# Patient Record
Sex: Male | Born: 1955 | Race: Black or African American | Hispanic: No | Marital: Married | Smoking: Never smoker
Health system: Southern US, Community
[De-identification: ages and names within clinical notes are randomized; demographics above are authoritative.]

## PROBLEM LIST (undated history)

## (undated) DIAGNOSIS — Z6841 Body Mass Index (BMI) 40.0 and over, adult: Secondary | ICD-10-CM

## (undated) DIAGNOSIS — E119 Type 2 diabetes mellitus without complications: Secondary | ICD-10-CM

## (undated) DIAGNOSIS — Z923 Personal history of irradiation: Secondary | ICD-10-CM

## (undated) DIAGNOSIS — I10 Essential (primary) hypertension: Secondary | ICD-10-CM

## (undated) DIAGNOSIS — C61 Malignant neoplasm of prostate: Secondary | ICD-10-CM

## (undated) HISTORY — DX: Body Mass Index (BMI) 40.0 and over, adult: Z684

## (undated) HISTORY — PX: FOOT SURGERY: SHX648

## (undated) HISTORY — DX: Personal history of irradiation: Z92.3

## (undated) HISTORY — PX: PROSTATE BIOPSY: SHX241

## (undated) HISTORY — DX: Morbid (severe) obesity due to excess calories: E66.01

## (undated) HISTORY — DX: Type 2 diabetes mellitus without complications: E11.9

---

## 2010-06-15 HISTORY — PX: KNEE SURGERY: SHX244

## 2010-10-08 ENCOUNTER — Emergency Department (HOSPITAL_COMMUNITY): Payer: Worker's Compensation

## 2010-10-08 ENCOUNTER — Observation Stay (HOSPITAL_COMMUNITY)
Admission: EM | Admit: 2010-10-08 | Discharge: 2010-10-11 | Disposition: A | Discharge status: Home / Self Care | Payer: Worker's Compensation | Attending: Orthopedic Surgery | Admitting: Orthopedic Surgery

## 2010-10-08 DIAGNOSIS — Y9269 Other specified industrial and construction area as the place of occurrence of the external cause: Secondary | ICD-10-CM | POA: Insufficient documentation

## 2010-10-08 DIAGNOSIS — W108XXA Fall (on) (from) other stairs and steps, initial encounter: Secondary | ICD-10-CM | POA: Insufficient documentation

## 2010-10-08 DIAGNOSIS — Y990 Civilian activity done for income or pay: Secondary | ICD-10-CM | POA: Insufficient documentation

## 2010-10-08 DIAGNOSIS — I10 Essential (primary) hypertension: Secondary | ICD-10-CM | POA: Insufficient documentation

## 2010-10-08 DIAGNOSIS — S838X9A Sprain of other specified parts of unspecified knee, initial encounter: Principal | ICD-10-CM | POA: Insufficient documentation

## 2010-10-08 LAB — COMPREHENSIVE METABOLIC PANEL
ALT: 23 U/L (ref 0–53)
Alkaline Phosphatase: 75 U/L (ref 39–117)
CO2: 28 mEq/L (ref 19–32)
Calcium: 9 mg/dL (ref 8.4–10.5)
Chloride: 105 mEq/L (ref 96–112)
GFR calc non Af Amer: >60 mL/min (ref >60)
Glucose, Bld: 113 mg/dL — ABNORMAL HIGH (ref 70–99)
Potassium: 4.1 mEq/L (ref 3.5–5.1)
Sodium: 138 mEq/L (ref 135–145)
Total Bilirubin: 0.4 mg/dL (ref 0.3–1.2)

## 2010-10-08 LAB — CBC
Hemoglobin: 13 g/dL (ref 13.0–17.0)
MCH: 27.2 pg (ref 26.0–34.0)
MCHC: 32.7 g/dL (ref 30.0–36.0)

## 2010-10-08 LAB — DIFFERENTIAL
Basophils Relative: 0 % (ref 0–1)
Eosinophils Absolute: 0 10*3/uL (ref 0.0–0.7)
Monocytes Absolute: 0.5 10*3/uL (ref 0.1–1.0)
Monocytes Relative: 5 % (ref 3–12)
Neutro Abs: 8.4 10*3/uL — ABNORMAL HIGH (ref 1.7–7.7)

## 2010-10-08 LAB — PROTIME-INR: Prothrombin Time: 13.9 seconds (ref 11.6–15.2)

## 2010-10-09 LAB — SURGICAL PCR SCREEN: Staphylococcus aureus: NEGATIVE

## 2010-10-10 LAB — BASIC METABOLIC PANEL
BUN: 15 mg/dL (ref 6–23)
Calcium: 8.4 mg/dL (ref 8.4–10.5)
Creatinine, Ser: 1 mg/dL (ref 0.4–1.5)
GFR calc Af Amer: >60 mL/min (ref >60)

## 2010-10-10 LAB — CBC
MCH: 27.1 pg (ref 26.0–34.0)
MCHC: 32.8 g/dL (ref 30.0–36.0)
Platelets: 307 10*3/uL (ref 150–400)
RDW: 14.7 % (ref 11.5–15.5)

## 2010-10-17 NOTE — Op Note (Signed)
NAME:  Bryan Newman, Bryan Newman             ACCOUNT NO.:  [id number]  MEDICAL RECORD NO.:  [id number]          PATIENT TYPE:  LOCATION:                                 FACILITY:  PHYSICIAN:  Jones Broom, MD    DATE OF BIRTH:  06/24/1955  DATE OF PROCEDURE:  10/09/2010 DATE OF DISCHARGE:                              OPERATIVE REPORT   PREOPERATIVE DIAGNOSES: 1. Right quadriceps tendon rupture. 2. Left quadriceps tendon rupture.  POSTOPERATIVE DIAGNOSES: 1. Right quadriceps tendon rupture. 2. Left quadriceps tendon rupture.  PROCEDURE PERFORMED: 1. Open repair right quadriceps tendon. 2. Open repair left quadriceps tendon.  ATTENDING SURGEON:  Jones Broom, MD  ASSISTANT:  None.  ANESTHESIA:  GETA.  COMPLICATIONS:  None.  DRAINS:  None.  SPECIMENS:  None.  ESTIMATED BLOOD LOSS:  100 mL.  TOURNIQUET TIME:  None.  INDICATIONS FOR SURGERY:  The patient is a 55 year old gentleman who had a fall yesterday at work and stumbled down a couple of steps suffering a hyperflexion injury to both knees.  He felt a pop on both sides and was unable to ambulate after that time.  His exam was consistent with bilateral quadriceps tendon ruptures.  He was indicated for surgery to promote ambulation and restore the extensor mechanism.  He understood risks, benefits, and alternatives of the procedure including but not limited to risk of bleeding, infection, damage to neurovascular structures, risk of incomplete healing, and potential need for future surgery.  He elected to go forward with surgery.  FINDINGS:  Both the quadriceps tendons were repaired using one #5 FiberWire and two #2 FiberWire and each in a running locking Krackow suture through four bone tunnels in each patella.  The retinaculum was repaired.  The repair was felt to be excellent on both sides.  PROCEDURE:  The patient was identified in the preoperative holding area where I personally marked the operative sites  after verifying the site, side, and procedure with the patient.  He was then taken back to the operating room where general anesthesia was induced without complication.  He did receive 2 g IV Ancef within 30 minutes of the incision.  The bilateral lower extremities were prepped and draped in a standard sterile fashion.  The appropriate time-out procedure was carried out.  An approximately 8-cm incision was then made over the left patella.  Dissection was carried down to the level of the rupture. Medial and lateral skin flaps were made.  The rupture was identified and found to be complete.  There was extensive tearing of the medial and lateral retinaculum as well.  Copious irrigation was used.  No fracture was noted.  The superior portion of the patella was debrided using a rongeur.  Anterior soft tissues were left intact.  The tendon did rupture directly off its bony insertion.  The bony insertion was rongeured down to bleeding trough to promote healing.  Attention was then turned to the distal quadriceps tendon.  The tendon edge was ragged and was debrided slightly to trying and get back to more healthy- appearing tissue while still retaining length.  Three running locking Krackow sutures were then placed with  one central, one medial, and one lateral.  The central was a #5 FiberWire and the medial and lateral sutures were #2 FiberWire.  Tendon quality for the repair was noted to be very good.  Four drill holes were then made sequentially with an approximately 1.5-mm drill bit and each suture was then passed with two sutures through the middle two holes to allow tunneling over bone bridge.  The tendon was reduced and each suture was then sequentially tied bringing the tendon nicely down to the prepared superior pole of the patella.  After the repair, the medial and lateral retinacular rents were repaired using a 0 Vicryl suture and the anterior soft tissues were oversewn over the repair  using a running locking 0 Vicryl suture. Again, the wound was copiously irrigated and subsequently closed in layers using 2-0 Vicryl for the deep dermal layer and staples for skin closure.  The right wound was then covered and attention was then turned to the left where the exact same procedure as described above the right was performed.  There was absolutely no variation in the procedure as described above.  The tendon quality was also noted to be good except for the distal edge which was debrided.  Medial and lateral rents in the retinaculum were also repaired.  The repair quality was felt to be excellent.  After skin closure, both sides were then sterilely dressed using Adaptic, 4 x 4's, ABDs, Kerlix, and Ace bandage wrapped loosely. The patient was placed in bilateral knee immobilizers and allowed to awaken from general anesthesia.  He was transferred to the stretcher, taken to the recovery room in stable condition.  POSTOPERATIVE PLAN:  He will work with physical therapy with weightbearing as tolerated in his knee immobilizers with a walker.  He will remain in full extension.  He will have aspirin and SCDs for DVT prophylaxis.  He will be discharged when he clears physical therapy and his pain is well controlled.     Jones Broom, MD     JC/MEDQ  D:  10/09/2010  T:  10/10/2010  Job:  [id number]  Electronically Signed by Jones Broom  on 10/17/2010 08:23:27 AM

## 2010-10-17 NOTE — H&P (Signed)
NAME:  Bryan Newman, Bryan Newman             ACCOUNT NO.:  [id number]  MEDICAL RECORD NO.:  [id number]           PATIENT TYPE:  O  LOCATION:  5004                         FACILITY:  [facility]  PHYSICIAN:  Jones Broom, MD    DATE OF BIRTH:  July 24, 1955  DATE OF ADMISSION:  10/08/2010 DATE OF DISCHARGE:                             HISTORY & PHYSICAL   CHIEF COMPLAINT:  Bilateral knee pain and inability to ambulate after a fall.  HISTORY OF PRESENT ILLNESS:  Bryan Newman is a very pleasant 55 year old gentleman with a history of hypertension who had a fall at work.  He stumbled, coming down some steps and had hyperflexion injuries to both knees.  He felt a pop in both legs.  He noted that he was unable to ambulate after that time and had significant pain over the anterior- superior knees.  Does not describe any previous pain in this region prior to this event.  No other tendon ruptures that he is aware of.  He has not been on prolonged steroid medications.  PAST MEDICAL HISTORY:  Hypertension.  No diabetes.  FAMILY HISTORY:  Noncontributory.  SOCIAL HISTORY:  Denies tobacco or alcohol use.  He works as a Naval architect.  DRUG ALLERGIES:  NKDA.  MEDICATIONS:  Benicar.  REVIEW OF SYSTEMS:  Denies any numbness, tingling or weakness distal to the knees.  Otherwise negative except as described above.  PHYSICAL EXAMINATION:  GENERAL/VITAL SIGNS:  Today, he is afebrile with stable vital signs.  He is awake, alert and oriented x3. HEENT:  Extraocular motions intact. CHEST:  No use of accessory respiratory muscle for breathing. SKIN:  No skin rash. EXTREMITIES:  Examination of bilateral lower extremities demonstrates mild skin swelling, suprapatellar area.  I am able to palpate the gap on both sides, which is more obvious on the left than the right.  He is unable to perform straight leg raise on either leg.  Unable to resist any knee flexion at all.  I can see the quadriceps contracting on  both sides.  Distally, he has normal sensation to light touch on the dorsal and plantar aspect of the feet, has good strength in ankle dorsiflexion and plantar flexion and EHL.  2+ pedal pulses.  DIAGNOSTIC STUDIES:  X-rays of bilateral knees demonstrates mild chronic bony changes in the superior pole of the patella left greater than right.  Soft tissue findings appear consistent with quad tendon rupture, no acute fractures noted.  LABORATORY STUDIES:  Hemoglobin is 13.  IMPRESSION:  A 55 year old with bilateral acute quadriceps tendon ruptures.  PLAN:  He will be admitted to my service and the plan will be for operative fixation of both quadriceps tendons tomorrow.  He will be n.p.o.  He understands the risks, benefits and alternatives to procedure including but not limited to risk of bleeding, infection, damage to neurovascular structures, risk of nonhealing and re-tear.  He understands this and we would like to go ahead with surgery.  He will be kept in the hospital postoperatively for physical therapy until he is able to say that he is safe to get home with his family.  Jones Broom, MD     JC/MEDQ  D:  10/09/2010  T:  10/09/2010  Job:  [id number]  Electronically Signed by Jones Broom  on 10/17/2010 08:23:24 AM

## 2013-02-07 ENCOUNTER — Emergency Department (HOSPITAL_BASED_OUTPATIENT_CLINIC_OR_DEPARTMENT_OTHER)
Admission: EM | Admit: 2013-02-07 | Discharge: 2013-02-07 | Disposition: A | Discharge status: Home / Self Care | Payer: Managed Care, Other (non HMO) | Attending: Emergency Medicine | Admitting: Emergency Medicine

## 2013-02-07 ENCOUNTER — Emergency Department (HOSPITAL_BASED_OUTPATIENT_CLINIC_OR_DEPARTMENT_OTHER): Payer: Managed Care, Other (non HMO)

## 2013-02-07 ENCOUNTER — Encounter (HOSPITAL_BASED_OUTPATIENT_CLINIC_OR_DEPARTMENT_OTHER): Payer: Self-pay | Admitting: *Deleted

## 2013-02-07 DIAGNOSIS — I10 Essential (primary) hypertension: Secondary | ICD-10-CM | POA: Insufficient documentation

## 2013-02-07 DIAGNOSIS — K047 Periapical abscess without sinus: Secondary | ICD-10-CM

## 2013-02-07 HISTORY — DX: Essential (primary) hypertension: I10

## 2013-02-07 LAB — CBC WITH DIFFERENTIAL/PLATELET
Basophils Absolute: 0 10*3/uL (ref 0.0–0.1)
Eosinophils Absolute: 0.1 10*3/uL (ref 0.0–0.7)
Lymphocytes Relative: 22 % (ref 12–46)
Lymphs Abs: 1.7 10*3/uL (ref 0.7–4.0)
Neutrophils Relative %: 64 % (ref 43–77)
Platelets: 349 10*3/uL (ref 150–400)
RBC: 4.66 MIL/uL (ref 4.22–5.81)
RDW: 15.8 % — ABNORMAL HIGH (ref 11.5–15.5)
WBC: 7.9 10*3/uL (ref 4.0–10.5)

## 2013-02-07 LAB — BASIC METABOLIC PANEL
CO2: 30 mEq/L (ref 19–32)
Calcium: 9.6 mg/dL (ref 8.4–10.5)
GFR calc non Af Amer: 83 mL/min — ABNORMAL LOW (ref >90)
Glucose, Bld: 130 mg/dL — ABNORMAL HIGH (ref 70–99)
Potassium: 3.7 mEq/L (ref 3.5–5.1)
Sodium: 138 mEq/L (ref 135–145)

## 2013-02-07 MED ORDER — SODIUM CHLORIDE 0.9 % IV BOLUS (SEPSIS)
1000.0000 mL | Freq: Once | INTRAVENOUS | Status: AC
  Administered 2013-02-07: 1000 mL via INTRAVENOUS

## 2013-02-07 MED ORDER — HYDROCODONE-ACETAMINOPHEN 5-325 MG PO TABS: 1.0000 | ORAL_TABLET | ORAL | Status: DC | PRN

## 2013-02-07 MED ORDER — CLINDAMYCIN HCL 300 MG PO CAPS: 300.0000 mg | ORAL_CAPSULE | Freq: Four times a day (QID) | ORAL | Status: DC

## 2013-02-07 MED ORDER — IOHEXOL 300 MG/ML  SOLN
75.0000 mL | Freq: Once | INTRAMUSCULAR | Status: AC | PRN
  Administered 2013-02-07: 75 mL via INTRAVENOUS

## 2013-02-07 MED ORDER — ACETAMINOPHEN 500 MG PO TABS
1000.0000 mg | ORAL_TABLET | Freq: Once | ORAL | Status: AC
  Administered 2013-02-07: 1000 mg via ORAL
  Filled 2013-02-07: qty 2

## 2013-02-07 NOTE — ED Provider Notes (Signed)
CSN: [id number]     Arrival date & time 02/07/13  1754 History   First MD Initiated Contact with Patient 02/07/13 1810     Chief Complaint  Patient presents with  . Facial Swelling   (Consider location/radiation/quality/duration/timing/severity/associated sxs/prior Treatment) HPI Comments: 57 year old male with 2 days of left sided facial pain. He feels that his face has been swollen, and is tender to touch. He feels like he is feeling pain deeper inside his face above his counts. He's had some dental pain and has set up an appointment with a dentist for next week. Denies any fevers or chills. Denies any shortness of breath or chest pain. No neck pain. Tried some ibuprofen with some relief.  The history is provided by the patient.    Past Medical History  Diagnosis Date  . Hypertension    Past Surgical History  Procedure Laterality Date  . Knee surgery     No family history on file. History  Substance Use Topics  . Smoking status: Never Smoker   . Smokeless tobacco: Not on file  . Alcohol Use: No    Review of Systems  Constitutional: Negative for fever and chills.  HENT: Positive for facial swelling. Negative for neck pain and neck stiffness.   Respiratory: Negative for cough, shortness of breath, wheezing and stridor.   Cardiovascular: Negative for chest pain.  Neurological: Negative for headaches.  All other systems reviewed and are negative.    Allergies  Review of patient's allergies indicates no known allergies.  Home Medications  No current outpatient prescriptions on file. BP 145/80  Pulse 79  Temp(Src) 98.3 F (36.8 C) (Oral)  Resp 18  Wt 345 lb (156.491 kg)  SpO2 99% Physical Exam  Nursing note and vitals reviewed. Constitutional: He is oriented to person, place, and time. He appears well-developed and well-nourished.  HENT:  Head: Normocephalic and atraumatic.  Right Ear: External ear normal.  Left Ear: External ear normal.  Nose: Nose normal.    Mouth/Throat: Abnormal dentition.    Tenderness over left maxilla. No gingival erythema or signs of apical abscess  Eyes: EOM are normal. Pupils are equal, round, and reactive to light. Right eye exhibits no discharge. Left eye exhibits no discharge.  Neck: Neck supple.  Cardiovascular: Normal rate, regular rhythm, normal heart sounds and intact distal pulses.   Pulmonary/Chest: Effort normal and breath sounds normal. He has no wheezes.  Abdominal: Soft. There is no tenderness.  Neurological: He is alert and oriented to person, place, and time. No cranial nerve deficit. GCS eye subscore is 4. GCS verbal subscore is 5. GCS motor subscore is 6.  Skin: Skin is warm and dry.    ED Course  Procedures (including critical care time) Labs Review Labs Reviewed  CBC WITH DIFFERENTIAL - Abnormal; Notable for the following:    Hemoglobin 12.7 (*)    RDW 15.8 (*)    All other components within normal limits  BASIC METABOLIC PANEL - Abnormal; Notable for the following:    Glucose, Bld 130 (*)    GFR calc non Af Amer 83 (*)    All other components within normal limits   Imaging Review Ct Maxillofacial W/cm  02/07/2013   *RADIOLOGY REPORT*  Clinical Data: Left-sided maxillary pain for 2 days.  Possible swelling.  CT MAXILLOFACIAL WITH CONTRAST  Technique:  Multidetector CT imaging of the maxillofacial structures was performed with intravenous contrast. Multiplanar CT image reconstructions were also generated.  Contrast: 75mL OMNIPAQUE IOHEXOL 300 MG/ML  SOLN  Comparison: None.  Findings: Dental caries is evident affecting the left mandibular premolar, and multiple left maxillary premolar and molar teeth. There are multiple mandibular teeth missing on the left.  There is slight periapical lucency surrounding the roots of the maxillary (tooth 13) premolar on the left(image 43 series 2) with slight stranding of the left malar fat representing a small periapical abscess and adjacent cellulitis. No  drainable soft tissue abscess is evident.  Chronic sinus disease is evident. No other osseous findings. Negative orbits.  Chronic paranasal sinus disease with  right greater than left maxillary sinus retention cysts.  Negative intracranial compartment.  IMPRESSION: Left maxillary periodontal disease with evidence for periapical abscess related to the roots of  tooth 13 which has resulted in adjacent left malar cellulitis.  No drainable soft tissue abscess.  Widespread dental caries.   Original Report Authenticated By: Davonna Belling, M.D.    MDM   1. Periapical abscess with facial involvement    Airway intact, has no trismus or focal drainable collection. CT shows early abscess and cellulitis. No systemic sx. Has a dentist he is following up with in the next week, will treat with pain control and abx and advise to f/u with dentist sooner for possible tooth extraction.     Audree Camel, MD 02/07/13 [phone]

## 2013-02-07 NOTE — ED Notes (Signed)
Left side of face swollen

## 2013-02-07 NOTE — ED Notes (Signed)
MD at bedside giving test results and discussing plan of care. 

## 2013-03-13 DIAGNOSIS — C61 Malignant neoplasm of prostate: Secondary | ICD-10-CM | POA: Insufficient documentation

## 2013-03-13 HISTORY — DX: Malignant neoplasm of prostate: C61

## 2013-03-16 ENCOUNTER — Other Ambulatory Visit (HOSPITAL_COMMUNITY): Payer: Self-pay | Admitting: Urology

## 2013-03-16 DIAGNOSIS — C61 Malignant neoplasm of prostate: Secondary | ICD-10-CM

## 2013-03-27 ENCOUNTER — Encounter (HOSPITAL_COMMUNITY)
Admission: RE | Admit: 2013-03-27 | Discharge: 2013-03-27 | Disposition: A | Discharge status: Home / Self Care | Payer: Managed Care, Other (non HMO) | Source: Ambulatory Visit | Attending: Urology | Admitting: Urology

## 2013-03-27 ENCOUNTER — Encounter (HOSPITAL_COMMUNITY): Payer: Self-pay

## 2013-03-27 DIAGNOSIS — C61 Malignant neoplasm of prostate: Secondary | ICD-10-CM

## 2013-03-27 HISTORY — DX: Malignant neoplasm of prostate: C61

## 2013-03-27 MED ORDER — TECHNETIUM TC 99M MEDRONATE IV KIT
25.0000 | PACK | Freq: Once | INTRAVENOUS | Status: AC | PRN
  Administered 2013-03-27: 25 via INTRAVENOUS

## 2013-04-05 ENCOUNTER — Encounter: Payer: Self-pay | Admitting: *Deleted

## 2013-04-10 ENCOUNTER — Encounter: Payer: Self-pay | Admitting: Radiation Oncology

## 2013-04-10 NOTE — Progress Notes (Signed)
GU Location of Tumor / Histology: prostate  If Prostate Cancer, Gleason Score is (4 + 3) and PSA is (17.62 on 01/23/13)  Patient presented Feb 2011 with signs/symptoms of:  with PSA 6.17  Biopsies of proatate (if applicable) revealed: 03/13/13 Biopsy - adenocarcinoma, Gleason 4+3=7, volume 20.80 cc  Past/Anticipated interventions by urology, if any: benign biopsy in Feb 2011, pt did not FU. Repeat biopsy 03/13/13  Past/Anticipated interventions by medical oncology, if any: none  Weight changes, if any: no  Bowel/Bladder complaints, if any: urinary frequency, nocturia x 2, IPSS 7  Nausea/Vomiting, if any: no  Pain issues, if any:  no  SAFETY ISSUES:  Prior radiation? no  Pacemaker/ICD? no  Possible current pregnancy? na  Is the patient on methotrexate? 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

## 2013-04-11 ENCOUNTER — Ambulatory Visit
Admission: RE | Admit: 2013-04-11 | Discharge: 2013-04-11 | Disposition: A | Discharge status: Home / Self Care | Payer: Managed Care, Other (non HMO) | Source: Ambulatory Visit | Attending: Radiation Oncology | Admitting: Radiation Oncology

## 2013-04-11 ENCOUNTER — Encounter: Payer: Self-pay | Admitting: Radiation Oncology

## 2013-04-11 VITALS — BP 136/81 | HR 72 | Temp 98.5°F | Resp 20 | Ht 69.0 in | Wt 357.1 lb

## 2013-04-11 DIAGNOSIS — C61 Malignant neoplasm of prostate: Secondary | ICD-10-CM

## 2013-04-11 DIAGNOSIS — Z79899 Other long term (current) drug therapy: Secondary | ICD-10-CM | POA: Insufficient documentation

## 2013-04-11 DIAGNOSIS — I10 Essential (primary) hypertension: Secondary | ICD-10-CM | POA: Insufficient documentation
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
## 2013-04-11 NOTE — Progress Notes (Signed)
Please see the Nurse Progress Note in the MD Initial Consult Encounter for this patient. 

## 2013-04-13 ENCOUNTER — Telehealth: Payer: Self-pay | Admitting: *Deleted

## 2013-04-13 NOTE — Telephone Encounter (Signed)
CALLED PATIENT TO INFORM OF FNC ON 06-06-13- ARRIVAL TIME - 9 AM , SPOKE WITH PATIENT'S WIFE AND SHE IS AWARE OF THIS APPT.

## 2013-06-02 NOTE — Progress Notes (Addendum)
GU Location of Tumor / Histology: prostate   If Prostate Cancer, Gleason Score is (4 + 3) and PSA is (17.62 on 01/23/13)   Patient presented Feb 2011 with signs/symptoms of: with PSA 6.17   Biopsies of proatate (if applicable) revealed: 03/13/13 Biopsy - adenocarcinoma, Gleason 4+3=7, volume 20.80 cc   Past/Anticipated interventions by urology, if any: benign biopsy in Feb 2011, pt did not FU. Repeat biopsy 03/13/13   Past/Anticipated interventions by medical oncology, if any: none  Weight changes, if any: no  Bowel/Bladder complaints, if any: urinary frequency, nocturia x 2, IPSS 4, c/o night sweats, hot flashes, difficulty sleeping. Pt asked what he can do about his sleeping difficulty. Advised pt consider OTC sleep aids, but discuss OTC sleep aids w/pharmacist to prevent interactions with his Losartan-HCTZ.   Nausea/Vomiting, if any: no  Pain issues, if any: no   SAFETY ISSUES:  Prior radiation? no  Pacemaker/ICD? no  Possible current pregnancy? na  Is the patient on methotrexate? 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

## 2013-06-06 ENCOUNTER — Encounter: Payer: Self-pay | Admitting: Radiation Oncology

## 2013-06-06 ENCOUNTER — Ambulatory Visit
Admission: RE | Admit: 2013-06-06 | Discharge: 2013-06-06 | Disposition: A | Discharge status: Home / Self Care | Payer: Managed Care, Other (non HMO) | Source: Ambulatory Visit | Attending: Radiation Oncology | Admitting: Radiation Oncology

## 2013-06-06 ENCOUNTER — Telehealth: Payer: Self-pay | Admitting: *Deleted

## 2013-06-06 VITALS — BP 125/80 | HR 69 | Temp 97.8°F | Resp 20 | Wt 353.7 lb

## 2013-06-06 DIAGNOSIS — Z79899 Other long term (current) drug therapy: Secondary | ICD-10-CM | POA: Insufficient documentation

## 2013-06-06 DIAGNOSIS — C61 Malignant neoplasm of prostate: Secondary | ICD-10-CM | POA: Insufficient documentation

## 2013-06-06 NOTE — Telephone Encounter (Signed)
CALLED PATIENT TO INFORM OF GOLD SEED PLACEMENT ON 06-29-13- ARRIVAL TIME- 7:45 AM AT DR. Brunilda Payor' OFFICE, LVM FOR A RETURN CALL

## 2013-06-06 NOTE — Progress Notes (Signed)
CC: Dr. 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 Bryan Newman is a pleasant 57 year old male who is seen today for review and scheduling of his radiation therapy. I first saw him in consultation on 04/11/2013.  He was noted to have a slightly elevated PSA of 6.17 back in February 2011. He underwent ultrasound-guided biopsies by Dr. Brunilda Payor which showed atypia at the right apex. A followup PSA was recommended, but the patient admits that he did not return for his scheduled followup PSA. His next PSA by Dr. Renae Gloss was elevated and a repeat PSA with Dr. Brunilda Payor was 17.62 on 01/23/2013. He underwent repeat ultrasound-guided biopsies on 03/13/2013. He was found to have Gleason 7 (4+3) involving 50% of one core from the right apex and Gleason 7 (3+4) involving 5% of one core from the left base, 60% of one core from the left lateral mid gland, 60% of one core from the left mid gland, 10% of one core from the left lateral apex. He also had Gleason 6 (3+3) involving 5% of one core from right lateral mid gland and 20% of one core from left apex. His gland volume was approximately 20.8 cc. His staging workup included a bone scan on 03/27/2013 which was without evidence for metastatic disease.  He completed his staging workup with a CT scan of the pelvis on 04/17/2013 which was without evidence for metastatic disease. There was a probable bone [place] along the right iliac bone. He was started on androgen deprivation therapy on 04/17/2013. He has not yet had placement of his 3 gold seeds. He does report hot flashes as expected. He is doing well from a urinary standpoint with an I PSS score 4.  Physical examination: Alert and oriented. Filed Vitals:   06/06/13 0853  BP: 125/80  Pulse: 69  Temp: 97.8 F (36.6 C)  Resp: 20   Rectal examination not performed today.  Impression: This T1 C. 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 When he was here last, we placed on the CT  simulation table and we were able to completely images pelvis for external beam planning. We decided to proceed with external beam/IMRT, primarily because of his small gland size and expected further downsizing with androgen deprivation therapy for his high-risk disease. We discussed the potential acute and late toxicities of radiation therapy. We discussed treatment with a comfortably full bladder Consent is signed today. We need to get him scheduled for placement of 3 gold seeds with Dr. Brunilda Payor and have him return for simulation/treatment planning the week of January 19.  Plan: As above.  30 minutes was spent face-to-face with the patient, primarily counseling the patient and coordinating his care.

## 2013-06-06 NOTE — Addendum Note (Signed)
Encounter addended by: Glennie Hawk, RN on: 06/06/2013 10:45 AM<BR>     Documentation filed: Charges VN

## 2013-06-06 NOTE — Progress Notes (Signed)
Please see the Nurse Progress Note in the MD Initial Consult Encounter for this patient. 

## 2013-06-30 ENCOUNTER — Telehealth: Payer: Self-pay | Admitting: *Deleted

## 2013-06-30 NOTE — Telephone Encounter (Signed)
CALLED PATIENT TO REMIND OF APPT. FOR 07-03-13, LVM FOR A RETURN CALL

## 2013-07-03 ENCOUNTER — Ambulatory Visit
Admission: RE | Admit: 2013-07-03 | Discharge: 2013-07-03 | Disposition: A | Discharge status: Home / Self Care | Payer: Managed Care, Other (non HMO) | Source: Ambulatory Visit | Attending: Radiation Oncology | Admitting: Radiation Oncology

## 2013-07-03 DIAGNOSIS — Z510 Encounter for antineoplastic radiation therapy: Secondary | ICD-10-CM | POA: Insufficient documentation

## 2013-07-03 DIAGNOSIS — C61 Malignant neoplasm of prostate: Secondary | ICD-10-CM | POA: Insufficient documentation

## 2013-07-03 NOTE — Progress Notes (Signed)
Complex simulation/treatment planning note: The patient was taken to the CT simulator. He was placed supine. A Vac-Lok immobilization device was constructed for immobilization. A red rubber tube was placed in the rectal vault. He was then catheterized and contrast instilled into the bladder/urethra. He was then scanned. I chose and isocenter in the center of the prostate. The CT data set was sent to the MIM planning system where I contoured his prostate, seminal vesicles, rectum, bladder, and rectum sigmoid colon. I prescribing 7800 cGy in 40 sessions to his prostate PTV which represents the prostate was 0.8 cm except for 0.5 cm along the rectum. I prescribing 5600 cGy in 40 sessions to his seminal vesicle PTV which represents the seminal vesicles posterior 0.5 cm. He'll undergo daily cone beam CT, setting up to his 3 gold seeds. He'll be treated with a comfortably full bladder.

## 2013-07-04 ENCOUNTER — Encounter: Payer: Self-pay | Admitting: Radiation Oncology

## 2013-07-04 NOTE — Progress Notes (Signed)
IMRT simulation/treatment planning note: The patient completed his IMRT simulation/treatment planning in the management of his carcinoma the prostate. IMRT was chosen to decrease the risk for both acute and late bladder and rectal toxicity compared to 3-D conformal or conventional radiation therapy. Dose volume histograms were obtained for the target structures including the prostate and seminal vesicles and avoidance structures including the bladder, rectum, and femoral heads. We met our departmental guidelines. I prescribing 7800 cGy in 40 sessions to his prostate PTV and 5600 cGy in 40 sessions to his seminal vesicle PTV. I requesting daily cone beam CT, setting up to his 3 gold seeds. He is to be treated with a comfortably full bladder.

## 2013-07-12 ENCOUNTER — Ambulatory Visit
Admission: RE | Admit: 2013-07-12 | Discharge: 2013-07-12 | Disposition: A | Discharge status: Home / Self Care | Payer: Managed Care, Other (non HMO) | Source: Ambulatory Visit | Attending: Radiation Oncology | Admitting: Radiation Oncology

## 2013-07-12 DIAGNOSIS — C61 Malignant neoplasm of prostate: Secondary | ICD-10-CM

## 2013-07-12 NOTE — Progress Notes (Signed)
Chart note: The patient began his IMRT today in the management of his carcinoma the prostate. He is treated with 2 volume modulated arcs with 2 sets of dynamic MLCs representing one set of IMRT treatment devices [phone])

## 2013-07-13 ENCOUNTER — Ambulatory Visit
Admission: RE | Admit: 2013-07-13 | Discharge: 2013-07-13 | Disposition: A | Discharge status: Home / Self Care | Payer: Managed Care, Other (non HMO) | Source: Ambulatory Visit | Attending: Radiation Oncology | Admitting: Radiation Oncology

## 2013-07-14 ENCOUNTER — Ambulatory Visit
Admission: RE | Admit: 2013-07-14 | Discharge: 2013-07-14 | Disposition: A | Discharge status: Home / Self Care | Payer: Managed Care, Other (non HMO) | Source: Ambulatory Visit | Attending: Radiation Oncology | Admitting: Radiation Oncology

## 2013-07-17 ENCOUNTER — Ambulatory Visit
Admission: RE | Admit: 2013-07-17 | Discharge: 2013-07-17 | Disposition: A | Discharge status: Home / Self Care | Payer: Managed Care, Other (non HMO) | Source: Ambulatory Visit | Attending: Radiation Oncology | Admitting: Radiation Oncology

## 2013-07-17 ENCOUNTER — Ambulatory Visit: Payer: Managed Care, Other (non HMO) | Admitting: Radiation Oncology

## 2013-07-17 VITALS — BP 135/75 | HR 92 | Temp 99.2°F | Ht 69.0 in | Wt 358.8 lb

## 2013-07-17 DIAGNOSIS — C61 Malignant neoplasm of prostate: Secondary | ICD-10-CM

## 2013-07-17 NOTE — Progress Notes (Signed)
Weekly Management Note:  Site: Prostate Current Dose:  780  cGy Projected Dose: 7800  cGy  Narrative: The patient is seen today for routine under treatment assessment. CBCT/MVCT images/port films were reviewed. The chart was reviewed.   Bladder filling is satisfactory, but could be better. He did not feels that his bladder was too full. No GU or GI difficulties.  Physical Examination:  Filed Vitals:   07/17/13 1805  BP: 135/75  Pulse: 92  Temp: 99.2 F (37.3 C)  .  Weight: 358 lb 12.8 oz (162.751 kg). No change.  Impression: Tolerating radiation therapy well. I encouraged him to improve his bladder filling.  Plan: Continue radiation therapy as planned.

## 2013-07-17 NOTE — Progress Notes (Signed)
Bryan Newman has had 4 fractions to his prostate.  He denies pain, hematuria, diarrhea and dysuria.  He reports that he had nausea after treatment on Friday.  He reports a little bit of fatigue.  He was given the Radiation Therapy and you book and discussed potential side effects including fatigue, diarrhea, skin changes and bladder changes.

## 2013-07-18 ENCOUNTER — Ambulatory Visit
Admission: RE | Admit: 2013-07-18 | Discharge: 2013-07-18 | Disposition: A | Discharge status: Home / Self Care | Payer: Managed Care, Other (non HMO) | Source: Ambulatory Visit | Attending: Radiation Oncology | Admitting: Radiation Oncology

## 2013-07-19 ENCOUNTER — Ambulatory Visit
Admission: RE | Admit: 2013-07-19 | Discharge: 2013-07-19 | Disposition: A | Discharge status: Home / Self Care | Payer: Managed Care, Other (non HMO) | Source: Ambulatory Visit | Attending: Radiation Oncology | Admitting: Radiation Oncology

## 2013-07-20 ENCOUNTER — Ambulatory Visit
Admission: RE | Admit: 2013-07-20 | Discharge: 2013-07-20 | Disposition: A | Discharge status: Home / Self Care | Payer: Managed Care, Other (non HMO) | Source: Ambulatory Visit | Attending: Radiation Oncology | Admitting: Radiation Oncology

## 2013-07-21 ENCOUNTER — Ambulatory Visit
Admission: RE | Admit: 2013-07-21 | Discharge: 2013-07-21 | Disposition: A | Discharge status: Home / Self Care | Payer: Managed Care, Other (non HMO) | Source: Ambulatory Visit | Attending: Radiation Oncology | Admitting: Radiation Oncology

## 2013-07-24 ENCOUNTER — Ambulatory Visit
Admission: RE | Admit: 2013-07-24 | Discharge: 2013-07-24 | Disposition: A | Discharge status: Home / Self Care | Payer: Managed Care, Other (non HMO) | Source: Ambulatory Visit | Attending: Radiation Oncology | Admitting: Radiation Oncology

## 2013-07-24 VITALS — BP 126/64 | HR 91 | Temp 98.7°F | Ht 69.0 in | Wt 361.0 lb

## 2013-07-24 DIAGNOSIS — C61 Malignant neoplasm of prostate: Secondary | ICD-10-CM

## 2013-07-24 NOTE — Progress Notes (Signed)
Bryan Newman has had 9 fractions to his prostate.  He denies pain, fatigue, urinary frequency, nocturia, hematuria and diarrhea.  He reports that both of his hands were swollen yesterday when he got up.  The swelling went down after he moved around.  He said that is the first time it has happened.

## 2013-07-24 NOTE — Progress Notes (Signed)
Weekly Management Note:  Site: Prostate Current Dose:  1755  cGy Projected Dose: 7800  cGy  Narrative: The patient is seen today for routine under treatment assessment. CBCT/MVCT images/port films were reviewed. The chart was reviewed.   Bladder filling satisfactory. No new GU or GI difficulties.  Physical Examination:  Filed Vitals:   07/24/13 1842  BP: 126/64  Pulse: 91  Temp: 98.7 F (37.1 C)  .  Weight: 361 lb (163.749 kg). No change.  Impression: Tolerating radiation therapy well.  Plan: Continue radiation therapy as planned.

## 2013-07-25 ENCOUNTER — Ambulatory Visit
Admission: RE | Admit: 2013-07-25 | Discharge: 2013-07-25 | Disposition: A | Discharge status: Home / Self Care | Payer: Managed Care, Other (non HMO) | Source: Ambulatory Visit | Attending: Radiation Oncology | Admitting: Radiation Oncology

## 2013-07-26 ENCOUNTER — Ambulatory Visit
Admission: RE | Admit: 2013-07-26 | Discharge: 2013-07-26 | Disposition: A | Discharge status: Home / Self Care | Payer: Managed Care, Other (non HMO) | Source: Ambulatory Visit | Attending: Radiation Oncology | Admitting: Radiation Oncology

## 2013-07-27 ENCOUNTER — Ambulatory Visit
Admission: RE | Admit: 2013-07-27 | Discharge: 2013-07-27 | Disposition: A | Discharge status: Home / Self Care | Payer: Managed Care, Other (non HMO) | Source: Ambulatory Visit | Attending: Radiation Oncology | Admitting: Radiation Oncology

## 2013-07-28 ENCOUNTER — Ambulatory Visit
Admission: RE | Admit: 2013-07-28 | Discharge: 2013-07-28 | Disposition: A | Discharge status: Home / Self Care | Payer: Managed Care, Other (non HMO) | Source: Ambulatory Visit | Attending: Radiation Oncology | Admitting: Radiation Oncology

## 2013-07-31 ENCOUNTER — Ambulatory Visit
Admission: RE | Admit: 2013-07-31 | Discharge: 2013-07-31 | Disposition: A | Discharge status: Home / Self Care | Payer: Managed Care, Other (non HMO) | Source: Ambulatory Visit | Attending: Radiation Oncology | Admitting: Radiation Oncology

## 2013-07-31 ENCOUNTER — Encounter: Payer: Self-pay | Admitting: Radiation Oncology

## 2013-07-31 VITALS — BP 140/81 | HR 96 | Temp 97.8°F | Resp 20 | Wt 354.4 lb

## 2013-07-31 DIAGNOSIS — C61 Malignant neoplasm of prostate: Secondary | ICD-10-CM

## 2013-07-31 NOTE — Progress Notes (Signed)
Weekly Management Note:  Site: Prostate Current Dose:  2730  cGy Projected Dose: 7800  cGy  Narrative: The patient is seen today for routine under treatment assessment. CBCT/MVCT images/port films were reviewed. The chart was reviewed.   Bladder filling satisfactory. No new GU or GI difficulties.  Physical Examination:  Filed Vitals:   07/31/13 1505  BP: 140/81  Pulse: 96  Temp: 97.8 F (36.6 C)  Resp: 20  .  Weight: 354 lb 6.4 oz (160.755 kg). No change.  Impression: Tolerating radiation therapy well.  Plan: Continue radiation therapy as planned.

## 2013-07-31 NOTE — Progress Notes (Signed)
Pt denies pain, bladder/bowel issues, fatigue, loss of appetite. 

## 2013-08-01 ENCOUNTER — Ambulatory Visit
Admission: RE | Admit: 2013-08-01 | Discharge: 2013-08-01 | Disposition: A | Discharge status: Home / Self Care | Payer: Managed Care, Other (non HMO) | Source: Ambulatory Visit | Attending: Radiation Oncology | Admitting: Radiation Oncology

## 2013-08-02 ENCOUNTER — Ambulatory Visit
Admission: RE | Admit: 2013-08-02 | Discharge: 2013-08-02 | Disposition: A | Discharge status: Home / Self Care | Payer: Managed Care, Other (non HMO) | Source: Ambulatory Visit | Attending: Radiation Oncology | Admitting: Radiation Oncology

## 2013-08-03 ENCOUNTER — Ambulatory Visit: Payer: Managed Care, Other (non HMO)

## 2013-08-04 ENCOUNTER — Ambulatory Visit
Admission: RE | Admit: 2013-08-04 | Discharge: 2013-08-04 | Disposition: A | Discharge status: Home / Self Care | Payer: Managed Care, Other (non HMO) | Source: Ambulatory Visit | Attending: Radiation Oncology | Admitting: Radiation Oncology

## 2013-08-07 ENCOUNTER — Ambulatory Visit: Payer: Managed Care, Other (non HMO) | Admitting: Radiation Oncology

## 2013-08-07 ENCOUNTER — Ambulatory Visit
Admission: RE | Admit: 2013-08-07 | Discharge: 2013-08-07 | Disposition: A | Discharge status: Home / Self Care | Payer: Managed Care, Other (non HMO) | Source: Ambulatory Visit | Attending: Radiation Oncology | Admitting: Radiation Oncology

## 2013-08-08 ENCOUNTER — Ambulatory Visit
Admission: RE | Admit: 2013-08-08 | Discharge: 2013-08-08 | Disposition: A | Discharge status: Home / Self Care | Payer: Managed Care, Other (non HMO) | Source: Ambulatory Visit | Attending: Radiation Oncology | Admitting: Radiation Oncology

## 2013-08-08 ENCOUNTER — Encounter: Payer: Self-pay | Admitting: Radiation Oncology

## 2013-08-08 VITALS — BP 123/83 | HR 107 | Temp 97.8°F | Resp 20 | Wt 356.8 lb

## 2013-08-08 DIAGNOSIS — C61 Malignant neoplasm of prostate: Secondary | ICD-10-CM

## 2013-08-08 NOTE — Progress Notes (Signed)
Weekly rad tx prostate, 19/40 completed,  no c/o pain, no dysuria, reg bowels, "I'm good"  10:46 AM

## 2013-08-08 NOTE — Progress Notes (Signed)
Weekly Management Note:  Site: Prostate Current Dose:  3705  cGy Projected Dose: 7800  cGy  Narrative: The patient is seen today for routine under treatment assessment. CBCT/MVCT images/port films were reviewed. The chart was reviewed.   Bladder filling is satisfactory but could be improved. No GU or GI difficulties.  Physical Examination:  Filed Vitals:   08/08/13 1045  BP: 123/83  Pulse: 107  Temp: 97.8 F (36.6 C)  Resp: 20  .  Weight: 356 lb 12.8 oz (161.843 kg). No change.  Impression: Tolerating radiation therapy well. I encouraged him to improve his bladder filling. He will make more of an effort.  Plan: Continue radiation therapy as planned.

## 2013-08-09 ENCOUNTER — Ambulatory Visit
Admission: RE | Admit: 2013-08-09 | Discharge: 2013-08-09 | Disposition: A | Discharge status: Home / Self Care | Payer: Managed Care, Other (non HMO) | Source: Ambulatory Visit | Attending: Radiation Oncology | Admitting: Radiation Oncology

## 2013-08-10 ENCOUNTER — Ambulatory Visit
Admission: RE | Admit: 2013-08-10 | Discharge: 2013-08-10 | Disposition: A | Discharge status: Home / Self Care | Payer: Managed Care, Other (non HMO) | Source: Ambulatory Visit | Attending: Radiation Oncology | Admitting: Radiation Oncology

## 2013-08-11 ENCOUNTER — Ambulatory Visit
Admission: RE | Admit: 2013-08-11 | Discharge: 2013-08-11 | Disposition: A | Discharge status: Home / Self Care | Payer: Managed Care, Other (non HMO) | Source: Ambulatory Visit | Attending: Radiation Oncology | Admitting: Radiation Oncology

## 2013-08-14 ENCOUNTER — Encounter: Payer: Self-pay | Admitting: Radiation Oncology

## 2013-08-14 ENCOUNTER — Ambulatory Visit
Admission: RE | Admit: 2013-08-14 | Discharge: 2013-08-14 | Disposition: A | Discharge status: Home / Self Care | Payer: Managed Care, Other (non HMO) | Source: Ambulatory Visit | Attending: Radiation Oncology | Admitting: Radiation Oncology

## 2013-08-14 VITALS — BP 126/90 | HR 90 | Temp 98.0°F | Ht 69.0 in | Wt 356.8 lb

## 2013-08-14 DIAGNOSIS — C61 Malignant neoplasm of prostate: Secondary | ICD-10-CM

## 2013-08-14 NOTE — Progress Notes (Signed)
Bryan Newman has received 23 fractions to his pelvis for prostate cancer.   He denies any changes in urination, nor any diarrhea. Having hot flashes.

## 2013-08-14 NOTE — Progress Notes (Signed)
Weekly Management Note:  Site: Prostate Current Dose:  4485  cGy Projected Dose: 7800  cGy  Narrative: The patient is seen today for routine under treatment assessment. CBCT/MVCT images/port films were reviewed. The chart was reviewed.   Filling is satisfactory. No GU or GI difficulty.  Physical Examination:  Filed Vitals:   08/14/13 1206  BP: 126/90  Pulse: 90  Temp: 98 F (36.7 C)  .  Weight: 356 lb 12.8 oz (161.843 kg). No change.  Impression: Tolerating radiation therapy well.  Plan: Continue radiation therapy as planned.

## 2013-08-15 ENCOUNTER — Ambulatory Visit
Admission: RE | Admit: 2013-08-15 | Discharge: 2013-08-15 | Disposition: A | Discharge status: Home / Self Care | Payer: Managed Care, Other (non HMO) | Source: Ambulatory Visit | Attending: Radiation Oncology | Admitting: Radiation Oncology

## 2013-08-16 ENCOUNTER — Ambulatory Visit
Admission: RE | Admit: 2013-08-16 | Discharge: 2013-08-16 | Disposition: A | Discharge status: Home / Self Care | Payer: Managed Care, Other (non HMO) | Source: Ambulatory Visit | Attending: Radiation Oncology | Admitting: Radiation Oncology

## 2013-08-17 ENCOUNTER — Ambulatory Visit
Admission: RE | Admit: 2013-08-17 | Discharge: 2013-08-17 | Disposition: A | Discharge status: Home / Self Care | Payer: Managed Care, Other (non HMO) | Source: Ambulatory Visit | Attending: Radiation Oncology | Admitting: Radiation Oncology

## 2013-08-18 ENCOUNTER — Ambulatory Visit
Admission: RE | Admit: 2013-08-18 | Discharge: 2013-08-18 | Disposition: A | Discharge status: Home / Self Care | Payer: Managed Care, Other (non HMO) | Source: Ambulatory Visit | Attending: Radiation Oncology | Admitting: Radiation Oncology

## 2013-08-21 ENCOUNTER — Ambulatory Visit: Payer: Managed Care, Other (non HMO) | Admitting: Radiation Oncology

## 2013-08-21 ENCOUNTER — Ambulatory Visit
Admission: RE | Admit: 2013-08-21 | Discharge: 2013-08-21 | Disposition: A | Discharge status: Home / Self Care | Payer: Managed Care, Other (non HMO) | Source: Ambulatory Visit | Attending: Radiation Oncology | Admitting: Radiation Oncology

## 2013-08-21 ENCOUNTER — Encounter: Payer: Self-pay | Admitting: Radiation Oncology

## 2013-08-21 VITALS — BP 128/82 | HR 79 | Temp 98.5°F | Resp 20 | Wt 358.0 lb

## 2013-08-21 DIAGNOSIS — C61 Malignant neoplasm of prostate: Secondary | ICD-10-CM

## 2013-08-21 NOTE — Progress Notes (Signed)
Weekly Management Note:  Site: Prostate Current Dose:  5460  cGy Projected Dose: 7800  cGy  Narrative: The patient is seen today for routine under treatment assessment. CBCT/MVCT images/port films were reviewed. The chart was reviewed.   Bladder filling is satisfactory. No significant GU or GI difficulty.  Physical Examination:  Filed Vitals:   08/21/13 0955  BP: 128/82  Pulse: 79  Temp: 98.5 F (36.9 C)  Resp: 20  .  Weight: 358 lb (162.388 kg). No change.  Impression: Tolerating radiation therapy well.  Plan: Continue radiation therapy as planned.

## 2013-08-21 NOTE — Progress Notes (Signed)
Pt denies pain, urinary/bowel issues, fatigue, loss of appetite. 

## 2013-08-22 ENCOUNTER — Ambulatory Visit
Admission: RE | Admit: 2013-08-22 | Discharge: 2013-08-22 | Disposition: A | Discharge status: Home / Self Care | Payer: Managed Care, Other (non HMO) | Source: Ambulatory Visit | Attending: Radiation Oncology | Admitting: Radiation Oncology

## 2013-08-23 ENCOUNTER — Ambulatory Visit
Admission: RE | Admit: 2013-08-23 | Discharge: 2013-08-23 | Disposition: A | Discharge status: Home / Self Care | Payer: Managed Care, Other (non HMO) | Source: Ambulatory Visit | Attending: Radiation Oncology | Admitting: Radiation Oncology

## 2013-08-24 ENCOUNTER — Ambulatory Visit
Admission: RE | Admit: 2013-08-24 | Discharge: 2013-08-24 | Disposition: A | Discharge status: Home / Self Care | Payer: Managed Care, Other (non HMO) | Source: Ambulatory Visit | Attending: Radiation Oncology | Admitting: Radiation Oncology

## 2013-08-25 ENCOUNTER — Ambulatory Visit
Admission: RE | Admit: 2013-08-25 | Discharge: 2013-08-25 | Disposition: A | Discharge status: Home / Self Care | Payer: Managed Care, Other (non HMO) | Source: Ambulatory Visit | Attending: Radiation Oncology | Admitting: Radiation Oncology

## 2013-08-28 ENCOUNTER — Ambulatory Visit
Admission: RE | Admit: 2013-08-28 | Discharge: 2013-08-28 | Disposition: A | Discharge status: Home / Self Care | Payer: Managed Care, Other (non HMO) | Source: Ambulatory Visit | Attending: Radiation Oncology | Admitting: Radiation Oncology

## 2013-08-28 ENCOUNTER — Encounter: Payer: Self-pay | Admitting: Radiation Oncology

## 2013-08-28 VITALS — BP 133/81 | HR 79 | Temp 98.5°F | Resp 20 | Wt 360.2 lb

## 2013-08-28 DIAGNOSIS — C61 Malignant neoplasm of prostate: Secondary | ICD-10-CM

## 2013-08-28 NOTE — Progress Notes (Signed)
Weekly rad tx prostate: 33/40, patient denys any dysuria,hematuria, no urgency, or frequency, no nocturia,  having regular bowel movents, no c/o eating and drinking well,  11:53 AM

## 2013-08-28 NOTE — Progress Notes (Signed)
Weekly Management Note:  Site: Prostate Current Dose:  6435  cGy Projected Dose: 7800  cGy  Narrative: The patient is seen today for routine under treatment assessment. CBCT/MVCT images/port films were reviewed. The chart was reviewed.   Bladder filling is satisfactory but less than ideal. No GU or GI difficulties.  Physical Examination:  Filed Vitals:   08/28/13 1151  BP: 133/81  Pulse: 79  Temp: 98.5 F (36.9 C)  Resp: 20  .  Weight: 360 lb 3.2 oz (163.386 kg). No change.  Impression: Tolerating radiation therapy well. I encouraged him to improve his bladder filling.  Plan: Continue radiation therapy as planned.

## 2013-08-29 ENCOUNTER — Ambulatory Visit
Admission: RE | Admit: 2013-08-29 | Discharge: 2013-08-29 | Disposition: A | Discharge status: Home / Self Care | Payer: Managed Care, Other (non HMO) | Source: Ambulatory Visit | Attending: Radiation Oncology | Admitting: Radiation Oncology

## 2013-08-30 ENCOUNTER — Ambulatory Visit
Admission: RE | Admit: 2013-08-30 | Discharge: 2013-08-30 | Disposition: A | Discharge status: Home / Self Care | Payer: Managed Care, Other (non HMO) | Source: Ambulatory Visit | Attending: Radiation Oncology | Admitting: Radiation Oncology

## 2013-08-31 ENCOUNTER — Ambulatory Visit
Admission: RE | Admit: 2013-08-31 | Discharge: 2013-08-31 | Disposition: A | Discharge status: Home / Self Care | Payer: Managed Care, Other (non HMO) | Source: Ambulatory Visit | Attending: Radiation Oncology | Admitting: Radiation Oncology

## 2013-09-01 ENCOUNTER — Ambulatory Visit
Admission: RE | Admit: 2013-09-01 | Discharge: 2013-09-01 | Disposition: A | Discharge status: Home / Self Care | Payer: Managed Care, Other (non HMO) | Source: Ambulatory Visit | Attending: Radiation Oncology | Admitting: Radiation Oncology

## 2013-09-04 ENCOUNTER — Ambulatory Visit
Admission: RE | Admit: 2013-09-04 | Discharge: 2013-09-04 | Disposition: A | Discharge status: Home / Self Care | Payer: Managed Care, Other (non HMO) | Source: Ambulatory Visit | Attending: Radiation Oncology | Admitting: Radiation Oncology

## 2013-09-04 ENCOUNTER — Encounter: Payer: Self-pay | Admitting: Radiation Oncology

## 2013-09-04 VITALS — BP 154/94 | HR 86 | Temp 98.1°F | Ht 69.0 in | Wt 362.4 lb

## 2013-09-04 DIAGNOSIS — C61 Malignant neoplasm of prostate: Secondary | ICD-10-CM

## 2013-09-04 NOTE — Progress Notes (Signed)
Weekly Management Note:  Site: Prostate Current Dose:  7410  cGy Projected Dose: 7800  cGy  Narrative: The patient is seen today for routine under treatment assessment. CBCT/MVCT images/port films were reviewed. The chart was reviewed.   Bladder filling is satisfactory but less than ideal. No new GU or GI difficulties.  Physical Examination:  Filed Vitals:   09/04/13 1830  BP: 154/94  Pulse: 86  Temp: 98.1 F (36.7 C)  .  Weight: 362 lb 6.4 oz (164.384 kg). No change.  Impression: Tolerating radiation therapy well.  Plan: Continue radiation therapy as planned.

## 2013-09-04 NOTE — Progress Notes (Signed)
Bryan Newman has received 38 fractions to his prostate.  He denies any dysuria, urgency or frequency nor proctitis/diarrhea, but he admits to fatigue.

## 2013-09-05 ENCOUNTER — Ambulatory Visit: Payer: Managed Care, Other (non HMO)

## 2013-09-05 ENCOUNTER — Ambulatory Visit
Admission: RE | Admit: 2013-09-05 | Discharge: 2013-09-05 | Disposition: A | Discharge status: Home / Self Care | Payer: Managed Care, Other (non HMO) | Source: Ambulatory Visit | Attending: Radiation Oncology | Admitting: Radiation Oncology

## 2013-09-06 ENCOUNTER — Encounter: Payer: Self-pay | Admitting: Radiation Oncology

## 2013-09-06 ENCOUNTER — Ambulatory Visit
Admission: RE | Admit: 2013-09-06 | Discharge: 2013-09-06 | Disposition: A | Discharge status: Home / Self Care | Payer: Managed Care, Other (non HMO) | Source: Ambulatory Visit | Attending: Radiation Oncology | Admitting: Radiation Oncology

## 2013-09-06 NOTE — Progress Notes (Signed)
[facility] End of Treatment Note  Name:Bryan Newman  Date: 09/06/2013 OFB:[id number] DOB:Feb 04, 1956   Status:outpatient    CC: Salena Saner., MD  Dr. Lowella Bandy  REFERRING PHYSICIAN:  Dr. 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 Meals tolerated his treatment well with no significant GU or GI toxicities during his course of therapy.                           PLAN: Routine followup in one month. Patient instructed to call if questions or worsening complaints in interim. He'll continue with his androgen deprivation therapy under the direction of Dr. Janice Norrie.

## 2013-10-09 ENCOUNTER — Encounter: Payer: Self-pay | Admitting: *Deleted

## 2013-10-10 ENCOUNTER — Encounter: Payer: Self-pay | Admitting: Radiation Oncology

## 2013-10-10 ENCOUNTER — Ambulatory Visit
Admission: RE | Admit: 2013-10-10 | Discharge: 2013-10-10 | Disposition: A | Discharge status: Home / Self Care | Payer: Managed Care, Other (non HMO) | Source: Ambulatory Visit | Attending: Radiation Oncology | Admitting: Radiation Oncology

## 2013-10-10 VITALS — BP 138/72 | HR 90 | Temp 98.4°F | Resp 20 | Wt 358.0 lb

## 2013-10-10 DIAGNOSIS — C61 Malignant neoplasm of prostate: Secondary | ICD-10-CM

## 2013-10-10 NOTE — Progress Notes (Signed)
Pt denies bladder/bowel issues, fatigue, loss of appetite. He has FU w/Dr Janice Norrie in July 2015. He states he will receive another hormone injection at that appointment.

## 2013-10-10 NOTE — Progress Notes (Signed)
CC: Dr. Lowella Bandy  Followup:  Bryan Newman returns today approximately 1 month following completion of external beam/IMRT in the management of his stage TI C. 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 He continues to do well. He believes that he is back to his baseline urinary and bowel habits. He continues with his androgen deprivation therapy for a total of 2 years. I believe that this will be through November of 2016. He does report hot flashes but these are not particularly bothersome. His fatigue is mild. He'll see Dr. Janice Norrie for a followup visit this July.  Physical examination: Alert and oriented. Filed Vitals:   10/10/13 1049  BP: 138/72  Pulse: 90  Temp: 98.4 F (36.9 C)  Resp: 20   Rectal examination not performed today.  Impression: Satisfactory progress. He is to continue with his androgen deprivation therapy for a total of 2 years. He is tolerating this well. He'll see Dr. Janice Norrie for a followup visit in July. I've not scheduled the patient for a formal followup visit and I ask that Dr. Janice Norrie keep me posted on his progress.  Plan: As above.

## 2013-11-14 IMAGING — CT CT MAXILLOFACIAL W/ CM
1 series · 1 of 2 positions shown · IV contrast (omnipaque)
Comparison: None.

CLINICAL DATA: Left-sided maxillary pain for 2 days.  Possible
swelling.

CT MAXILLOFACIAL WITH CONTRAST
TECHNIQUE: Multidetector CT imaging of the maxillofacial
structures was performed with intravenous contrast. Multiplanar CT
image reconstructions were also generated.
Contrast: 75mL OMNIPAQUE IOHEXOL 300 MG/ML  SOLN

[Series 1: topogram 0.6 t20s · sagittal · 1.00mm/px · 1 of 2 slices shown]
[im 2/2]
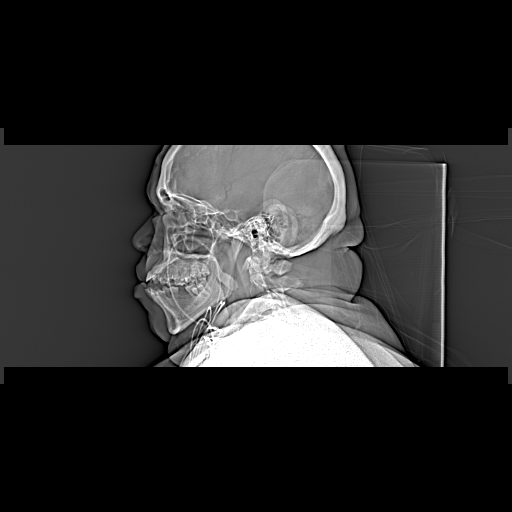

[1 of 2 positions shown; findings below may reference images not displayed]

FINDINGS: Dental caries is evident affecting the left mandibular
premolar, and multiple left maxillary premolar and molar teeth.
There are multiple mandibular teeth missing on the left.  There is
slight periapical lucency surrounding the roots of the maxillary
(tooth 13) premolar on the left(image 43 series 2) with slight
stranding of the left malar fat representing a small periapical
abscess and adjacent cellulitis. No drainable soft tissue abscess
is evident.

Chronic sinus disease is evident. No other osseous findings.
Negative orbits.  Chronic paranasal sinus disease with  right
greater than left maxillary sinus retention cysts.  Negative
intracranial compartment.
IMPRESSION: Left maxillary periodontal disease with evidence for periapical
abscess related to the roots of  tooth 13 which has resulted in
adjacent left malar cellulitis.  No drainable soft tissue abscess.

Widespread dental caries.

## 2014-01-05 ENCOUNTER — Encounter: Payer: Self-pay | Admitting: Cardiology

## 2014-10-22 ENCOUNTER — Ambulatory Visit: Payer: Self-pay | Admitting: Dietician

## 2014-10-29 ENCOUNTER — Ambulatory Visit: Payer: Self-pay | Admitting: Dietician

## 2014-11-05 ENCOUNTER — Encounter: Payer: Managed Care, Other (non HMO) | Attending: Internal Medicine | Admitting: Dietician

## 2014-11-05 ENCOUNTER — Encounter: Payer: Self-pay | Admitting: Dietician

## 2014-11-05 NOTE — Patient Instructions (Signed)
Rethink what you drink.  Avoid drinking beverages with carbohydrates.  This will make your blood sugar high. Exercise 5-6 days per week.  Great job with your motivation. Baked, broiled, grilled, more often than fried. Wheat eating out look at food labels.  Watch portions.

## 2014-11-05 NOTE — Progress Notes (Signed)
  Medical Nutrition Therapy:  Appt start time: 1400 end time:  7622.   Assessment:  Primary concerns today: Patient is here alone today and wants to learn to change his eating habits to be healthier.  HgbA1C 7.0% 07/30/14.  Hx also includes Extreme obesity with BMI of 48 and HTN.  Noted MD diet recommendations for low GI, plant based foods and decreased intake of processed foods.  Patient lives with wife.  Patient does the shopping and cooking because wife is caring for his father. 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  Preferred Learning Style:   No preference indicated   Learning Readiness:   Ready  MEDICATIONS: see list to include Metformin, omega 3, MVI, crestor, co Q 10, vitamin D.   DIETARY INTAKE: Fast food occasionally:  [employer] or [employer]   24-hr recall:  B (7-8 AM): home:  Grits, bacon, eggs, toast or Traveling:  Raisin Bran with  2% milk Snk ( AM): fruit and nuts  L (2-3 PM): 6" tuna sub, chips, lemonade Snk ( PM): occasional chips, occasional candy bar or muffin D (10-11 PM): (leftovers) Roasted chicken, veges, potatoes or pasta or rice,  Snk ( PM): none Beverages: water, simply lemonade, sweet tea, coffee with stevia and 2% milk  Usual physical activity:  Goes to gym at 4 am 3-4 times per week and walks on treadmill for 1 hour each time (when home)  Estimated energy needs: 1800 calories 200 g carbohydrates 135 g protein 50 g fat  Progress Towards Goal(s):  In progress.   Nutritional Diagnosis:  NB-1.1 Food and nutrition-related knowledge deficit As related to balance of carbohydrates, protein, and fat related to newly diagnosed type 2 diabetes.  As evidenced by diet hx and patient report.    Intervention:  Nutrition counseling and diabetes education initiated. Discussed Carb Counting by food group as method of portion control, reading food labels, and benefits of increased activity.  Also discussed basic physiology of Diabetes, target BG ranges pre and post meals, and A1c. Discussed tips to increase more vegetables and legumes into his diet.  Discussed eating out and increased sodium content as well as best choices.  Rethink what you drink.  Avoid drinking beverages with carbohydrates.  This will make your blood sugar high. Exercise 5-6 days per week.  Great job with your motivation. Baked, broiled, grilled, more often than fried. Wheat eating out look at food labels.  Watch portions.    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

## 2014-12-24 ENCOUNTER — Ambulatory Visit: Payer: Self-pay | Admitting: Dietician

## 2015-01-14 ENCOUNTER — Ambulatory Visit: Payer: Self-pay | Admitting: Dietician

## 2015-01-28 ENCOUNTER — Encounter: Payer: Self-pay | Admitting: Dietician

## 2015-01-28 ENCOUNTER — Encounter: Payer: Managed Care, Other (non HMO) | Attending: Internal Medicine | Admitting: Dietician

## 2015-01-28 VITALS — Wt 352.8 lb

## 2015-01-28 DIAGNOSIS — Z6841 Body Mass Index (BMI) 40.0 and over, adult: Secondary | ICD-10-CM | POA: Insufficient documentation

## 2015-01-28 DIAGNOSIS — E119 Type 2 diabetes mellitus without complications: Secondary | ICD-10-CM | POA: Diagnosis present

## 2015-01-28 DIAGNOSIS — Z713 Dietary counseling and surveillance: Secondary | ICD-10-CM | POA: Insufficient documentation

## 2015-01-28 NOTE — Patient Instructions (Signed)
Keep up the good work! Stay as active as possible.  Consider biking when the weather is cooler.   Protein in small amounts with all meals and snacks. Continue to be mindful of your food choices, portions, and hunger cues.

## 2015-01-28 NOTE — Progress Notes (Signed)
  Medical Nutrition Therapy:  Appt start time: 0321 end time:  1500.   Assessment: 11/05/14: Primary concerns today: Patient is here alone today and wants to learn to change his eating habits to be healthier.  HgbA1C 7.0% 07/30/14.  Hx also includes Extreme obesity with BMI of 48 and HTN.  Noted MD diet recommendations for low GI, plant based foods and decreased intake of processed foods.  Patient lives with wife.  Patient does the shopping and cooking because wife is caring for her father. 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  01/28/15: Patient is here alone.  He reports that wife has been very supportive.  He has changed to water and unsweetened tea.  He is mindful about choices, rarely eats fried foods, uses the [employer] app to make healthy food choices when eating out, and is mindful of portion sizes.  He has lost 10 lbs in the last 5 months.  He reports that his clothes are fitting better and he is feeling better.  Reports seeing MD today and labs were drawn.    Wt Readings from Last 3 Encounters:  11/05/14 356 lb (161.481 kg)  10/10/13 358 lb (162.388 kg)  09/04/13 362 lb 6.4 oz (164.384 kg)   Preferred Learning Style:   No preference indicated   Learning Readiness:   Ready  MEDICATIONS: see list to include Metformin, omega 3, MVI, crestor, co Q 10, vitamin D.   DIETARY INTAKE: Fast food occasionally:  [employer] or [employer]   24-hr recall:  Avoiding fried foods and is using the [employer] app to help make healthier choices when eating out. B (7-8 AM): home:  Grits, bacon, eggs, toast or Traveling:  Raisin Bran with  2% milk Snk ( AM): fruit and nuts if hungry L (2-3 PM): Sandwich from home Snk ( PM): fruit and nuts if hungry D (10-11 PM): (leftovers) Roasted chicken, veges, potatoes or pasta or rice, OR chili and salad out to eat Snk ( PM): none Beverages: water,unsweetened tea, coffee with  stevia and 2% milk  Usual physical activity:  Goes to gym at  3-4 times per week and walks on treadmill for 1 hour each time (when home) States that he has a bike and will begin biking when the weather is cooler.  Estimated energy needs: 1800 calories 200 g carbohydrates 135 g protein 50 g fat  Progress Towards Goal(s):  In progress.   Nutritional Diagnosis:  NB-1.1 Food and nutrition-related knowledge deficit As related to balance of carbohydrates, protein, and fat related to newly diagnosed type 2 diabetes.  As evidenced by diet hx and patient report.    Intervention:  Nutrition counseling and diabetes education follow up.  Reviewed rewards of continued consistent exercise.  Encouraged patient in the changes that he has made:  Consistent exercise, changing his beverages and choices when eating out.  Keep up the good work! Stay as active as possible.  Consider biking when the weather is cooler.   Protein in small amounts with all meals and snacks. Continue to be mindful of your food choices, portions, and hunger cues.  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

## 2017-04-12 ENCOUNTER — Encounter: Payer: Self-pay | Admitting: Internal Medicine

## 2017-04-12 ENCOUNTER — Ambulatory Visit (INDEPENDENT_AMBULATORY_CARE_PROVIDER_SITE_OTHER): Payer: Managed Care, Other (non HMO) | Admitting: Internal Medicine

## 2017-04-12 VITALS — BP 128/78 | HR 80 | Ht 71.0 in | Wt 357.8 lb

## 2017-04-12 DIAGNOSIS — G4733 Obstructive sleep apnea (adult) (pediatric): Secondary | ICD-10-CM

## 2017-04-12 DIAGNOSIS — I10 Essential (primary) hypertension: Secondary | ICD-10-CM | POA: Diagnosis not present

## 2017-04-12 NOTE — Patient Instructions (Signed)
Order- schedule unattended home sleep test    Dx OSA   Please call me for results and recommendation about 2 weeks after the sleep study  Call any time if needed
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
## 2017-04-13 DIAGNOSIS — I10 Essential (primary) hypertension: Secondary | ICD-10-CM | POA: Insufficient documentation

## 2017-04-13 DIAGNOSIS — G4733 Obstructive sleep apnea (adult) (pediatric): Secondary | ICD-10-CM | POA: Insufficient documentation

## 2017-04-13 NOTE — Assessment & Plan Note (Signed)
Consider bariatric referral

## 2017-04-13 NOTE — Assessment & Plan Note (Signed)
Managed by PCP

## 2017-04-13 NOTE — Assessment & Plan Note (Signed)
Tentative diagnosis based on history and physical, high probability. We discussed obstructive sleep apnea, medical concerns, treatment considerations. Plan-schedule sleep study. He will call for result anticipating we will likely order CPAP if appropriate.

## 2017-05-04 DIAGNOSIS — G4733 Obstructive sleep apnea (adult) (pediatric): Secondary | ICD-10-CM | POA: Diagnosis not present

## 2017-05-05 DIAGNOSIS — G4733 Obstructive sleep apnea (adult) (pediatric): Secondary | ICD-10-CM | POA: Diagnosis not present

## 2017-05-14 ENCOUNTER — Other Ambulatory Visit: Payer: Self-pay | Admitting: *Deleted

## 2017-05-14 DIAGNOSIS — G4733 Obstructive sleep apnea (adult) (pediatric): Secondary | ICD-10-CM

## 2017-07-16 ENCOUNTER — Telehealth: Payer: Self-pay | Admitting: Internal Medicine

## 2017-07-16 DIAGNOSIS — G4733 Obstructive sleep apnea (adult) (pediatric): Secondary | ICD-10-CM

## 2017-07-16 NOTE — Telephone Encounter (Signed)
Left voice mail on machine for patient to return phone call back regarding prior message today. X1

## 2017-07-19 ENCOUNTER — Ambulatory Visit: Payer: Self-pay | Admitting: Internal Medicine

## 2017-07-19 NOTE — Telephone Encounter (Signed)
Spoke with pt. States that he has an appointment with CY today but he feels it needs to be canceled. Reports that he never received the results of home sleep study from 03/2017 and doesn't know if he needs CPAP therapy. Pt's appointment for today has been canceled per his request.  CY - please advise on home sleep study results. Thanks.

## 2017-07-19 NOTE — Telephone Encounter (Signed)
I instructed him at our visit, and I wrote on his After Visit Summary sheet, asking him to call me for results and recommendations about 2 weeks after his sleep study, so we could start results if appropriate.  His study showed severe obstructive sleep apnea, averaging 89 apneas/ hour with drops in blood oxygen level.  I recommend we order new DME, new CPAP auto 5-20, mask of choice,  humidifier, supplies, Air/view   Dx OSA  Please make sure he has a return office visit between 31 and 90 days, per insurance regs.

## 2017-07-19 NOTE — Telephone Encounter (Signed)
Spoke with patient about results. He wishes to proceed with CPAP therapy. Appt has been scheduled for 10/18/17 at 10am. Will place order for CPAP machine.

## 2017-10-16 ENCOUNTER — Encounter: Payer: Self-pay | Admitting: Internal Medicine

## 2017-10-18 ENCOUNTER — Encounter: Payer: Self-pay | Admitting: Internal Medicine

## 2017-10-18 ENCOUNTER — Ambulatory Visit (INDEPENDENT_AMBULATORY_CARE_PROVIDER_SITE_OTHER): Payer: Managed Care, Other (non HMO) | Admitting: Internal Medicine

## 2017-10-18 DIAGNOSIS — G4733 Obstructive sleep apnea (adult) (pediatric): Secondary | ICD-10-CM | POA: Diagnosis not present

## 2017-10-18 MED ORDER — ZALEPLON 10 MG PO CAPS: 10.0000 mg | ORAL_CAPSULE | Freq: Every evening | ORAL | 3 refills | Status: DC | PRN

## 2017-10-18 NOTE — Patient Instructions (Addendum)
Order- [facility]- please refit CPAP mask for comfort and compliance- try nasal pillows. Continue CPAP auto 5-20, mask of choice, humidifier supplies, AirView  Restaurant manager, fast food for [employer] to use if needed.    Try taking one 15-20 minutes before bedtime as needed for sleep.  Please call as needed

## 2017-10-18 NOTE — Assessment & Plan Note (Signed)
He is willing to keep working at this.  We discussed his severe OSA and treatment options.  His wife came with him today but they live separately and she is not around to make sure he puts his machine on.  We will get his mask refitted.  I will also let him use Sonata for a while to make sleep onset and adjustment to CPAP easier.

## 2017-10-18 NOTE — Progress Notes (Signed)
HPI 62 year old male never smoker followed for OSA complicated by DM 2, prostate cancer/XRT, morbid obesity, HBP.  Professional truck driver, night shift. HST-05/04/2017-AHI 89.1/hour, desaturation to 68%, body weight 357 pounds  --------------------------------------------------------------------------------------------------------- 04/12/17-62 year old male never smoker for sleep evaluation. Sleep Consult-Dr Willey Blade. Never had sleep study.  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 On weekends he sleeps at night and is awake during the day. Denies stimulants other than one cup of coffee before work. No sleep medicines. Told that he snores loudly but doesn't know about witnessed apneas- lives alone. A sister has CPAP. No ENT surgery, heart or lung disease that he knows of. Not aware of any parasomnias. 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 Pt wears CPAP as best as he can-having trouble with mask. DL attached.  Download 17% compliance AHI 21.9/hour- Weight today 345 pounds He does not like his full facemask and is having to get used to sleeping on his back after previous habit sleeping on stomach.  We discussed compliance goals and choices.  ROS-see HPI  + = Positive Constitutional:    weight loss, night sweats, fevers, chills, fatigue, lassitude. HEENT:    headaches, difficulty swallowing, tooth/dental problems, sore throat,       sneezing, itching, ear ache, nasal congestion, post nasal drip, snoring CV:    chest pain, orthopnea, PND, swelling in lower extremities, anasarca,                                  dizziness,  palpitations Resp:   shortness of breath with exertion or at rest.                productive cough,   non-productive cough, coughing up of blood.              change in color of mucus.  wheezing.   Skin:    rash or lesions. GI:  No-   heartburn, indigestion, abdominal pain, nausea, vomiting, diarrhea,                 change in bowel habits, loss of appetite GU: dysuria, change in color of urine, no urgency or frequency.   flank pain. MS:   joint pain, stiffness, decreased range of motion, back pain. Neuro-     nothing unusual Psych:  change in mood or affect.  depression or anxiety.   memory loss.  OBJ- Physical Exam General- Alert, Oriented, Affect-appropriate, Distress- none acute, + morbid obesity Skin- rash-none, lesions- none, excoriation- none Lymphadenopathy- none Head- atraumatic            Eyes- Gross vision intact, PERRLA, conjunctivae and secretions clear            Ears- Hearing, canals-normal            Nose- Clear, no-Septal dev, mucus, polyps, erosion, perforation             Throat- Mallampati IV , mucosa clear , drainage- none, tonsils- atrophic, own teeth Neck- flexible , trachea midline, no stridor , thyroid nl, carotid no bruit Chest - symmetrical excursion , unlabored  Heart/CV- RRR , no murmur , no gallop  , no rub, nl s1 s2                           - JVD- none , edema- none, stasis changes- none, varices- none           Lung- clear to P&A, wheeze- none, cough- none , dullness-none, rub- none           Chest wall-  Abd-  Br/ Gen/ Rectal- Not done, not indicated Extrem- cyanosis- none, clubbing, none, atrophy- none, strength- nl Neuro- grossly intact to observation

## 2017-10-18 NOTE — Assessment & Plan Note (Signed)
He has lost some weight since his sleep study and is encouraged to keep pushing at this.

## 2018-01-20 ENCOUNTER — Encounter: Payer: Self-pay | Admitting: Internal Medicine

## 2018-01-24 ENCOUNTER — Encounter: Payer: Self-pay | Admitting: Internal Medicine

## 2018-01-24 ENCOUNTER — Ambulatory Visit: Payer: Managed Care, Other (non HMO) | Admitting: Internal Medicine

## 2018-01-24 DIAGNOSIS — G4733 Obstructive sleep apnea (adult) (pediatric): Secondary | ICD-10-CM | POA: Diagnosis not present

## 2018-01-24 MED ORDER — TRAZODONE HCL 50 MG PO TABS: ORAL_TABLET | ORAL | 5 refills | Status: AC

## 2018-01-24 NOTE — Assessment & Plan Note (Signed)
We discussed his recorded weight gain of 13 pounds since last visit.  He is morbidly overweight. Plan-referred for bariatric counseling

## 2018-01-24 NOTE — Patient Instructions (Addendum)
Stop using [employer] for Trazodone for sleep. You can use one or two at bedtime as needed for sleep,  It is really important that you put your CPAP on every night at bedtime, and wear it all night, every night. If it is uncomfortable we can try to help. If you just cannot wear it, please let us know, so we can decide what to do next.   Phone number for bariatric program

## 2018-01-24 NOTE — Assessment & Plan Note (Signed)
With his wife here today I reemphasized the importance of CPAP compliance.  He is more focused on waking from sleep.  His wife description makes it pretty clear that it is apnea events that mostly wake him and I explained the problem of suppressing respiratory drive with sleep medicines unless he uses CPAP. Plan-continue CPAP auto 5-20.  Replace Sonata with trazodone but strongly emphasized that he must put CPAP on at the beginning of bedtime and wear it through the night.

## 2018-01-24 NOTE — Progress Notes (Signed)
HPI 62 year old male never smoker followed for OSA complicated by DM 2, prostate cancer/XRT, morbid obesity, HBP.  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 Pt wears CPAP as best as he can-having trouble with mask. DL attached.  Download 17% compliance AHI 21.9/hour- Weight today 345 pounds He does not like his full facemask and is having to get used to sleeping on his back after previous habit sleeping on stomach.  We discussed compliance goals and choices.  01/24/2018- 62 year old male never smoker followed for OSA complicated by DM 2, prostate cancer/XRT, morbid obesity, HBP.  Professional truck driver CPAP auto 1-54/[facility] -----OSA: DME: [facility].Pt wears CPAP most of the time and DL attached. Pt has trouble with sleeping over 3.5 hours-even with sleep meds.  Weight today 358 pounds Sonata 10 mg He comes with wife today.  His primary complaint is that he wakes from sleep after 3 hours despite Sonata.  He has not been using CPAP effectively although he prefers his current nasal pillows mask with chinstrap over his previous full facemask.  We discussed the importance of treating this problem. I also pointed out his 13 pound weight gain since last visit and he agrees to seek bariatric counseling.  ROS-see HPI  + = Positive Constitutional:    weight loss, night sweats, fevers, chills, fatigue, lassitude. HEENT:    headaches, difficulty swallowing, tooth/dental problems, sore throat,       sneezing, itching, ear ache, nasal congestion, post nasal  drip, snoring CV:    chest pain, orthopnea, PND, swelling in lower extremities, anasarca,                                  dizziness, palpitations Resp:   shortness of breath with exertion or at rest.                productive cough,   non-productive cough, coughing up of blood.              change in color of mucus.  wheezing.   Skin:    rash or lesions. GI:  No-   heartburn, indigestion, abdominal pain, nausea, vomiting, diarrhea,                 change in bowel habits, loss of appetite GU: dysuria, change in color of urine, no urgency or frequency.   flank pain. MS:   joint pain, stiffness, decreased range of motion, back pain. Neuro-     nothing unusual Psych:  change in mood or affect.  depression or anxiety.   memory loss.  OBJ- Physical Exam General- Alert, Oriented, Affect-appropriate, Distress- none acute, + morbid obesity Skin- rash-none, lesions- none, excoriation- none Lymphadenopathy- none Head- atraumatic            Eyes- Gross vision intact, PERRLA, conjunctivae and secretions clear            Ears- Hearing, canals-normal            Nose- Clear, no-Septal dev, mucus, polyps, erosion, perforation             Throat- Mallampati IV , mucosa clear , drainage- none, tonsils- atrophic, own teeth Neck-  flexible , trachea midline, no stridor , thyroid nl, carotid no bruit Chest - symmetrical excursion , unlabored           Heart/CV- RRR , no murmur , no gallop  , no rub, nl s1 s2                           - JVD- none , edema- none, stasis changes- none, varices- none           Lung- clear to P&A, wheeze- none, cough- none , dullness-none, rub- none           Chest wall-  Abd-  Br/ Gen/ Rectal- Not done, not indicated Extrem- cyanosis- none, clubbing, none, atrophy- none, strength- nl Neuro- grossly intact to observation

## 2018-04-25 ENCOUNTER — Ambulatory Visit: Payer: Self-pay | Admitting: Internal Medicine

## 2022-07-16 DIAGNOSIS — Z419 Encounter for procedure for purposes other than remedying health state, unspecified: Secondary | ICD-10-CM | POA: Diagnosis not present

## 2022-08-14 DIAGNOSIS — Z419 Encounter for procedure for purposes other than remedying health state, unspecified: Secondary | ICD-10-CM | POA: Diagnosis not present

## 2022-09-14 DIAGNOSIS — Z419 Encounter for procedure for purposes other than remedying health state, unspecified: Secondary | ICD-10-CM | POA: Diagnosis not present

## 2022-09-15 DIAGNOSIS — E119 Type 2 diabetes mellitus without complications: Secondary | ICD-10-CM | POA: Diagnosis not present

## 2022-09-15 DIAGNOSIS — E7800 Pure hypercholesterolemia, unspecified: Secondary | ICD-10-CM | POA: Diagnosis not present

## 2022-10-14 DIAGNOSIS — Z419 Encounter for procedure for purposes other than remedying health state, unspecified: Secondary | ICD-10-CM | POA: Diagnosis not present

## 2022-11-14 DIAGNOSIS — Z419 Encounter for procedure for purposes other than remedying health state, unspecified: Secondary | ICD-10-CM | POA: Diagnosis not present

## 2022-12-14 DIAGNOSIS — Z419 Encounter for procedure for purposes other than remedying health state, unspecified: Secondary | ICD-10-CM | POA: Diagnosis not present

## 2023-01-14 DIAGNOSIS — Z419 Encounter for procedure for purposes other than remedying health state, unspecified: Secondary | ICD-10-CM | POA: Diagnosis not present

## 2023-02-05 DIAGNOSIS — G4733 Obstructive sleep apnea (adult) (pediatric): Secondary | ICD-10-CM | POA: Diagnosis not present

## 2023-02-14 DIAGNOSIS — Z419 Encounter for procedure for purposes other than remedying health state, unspecified: Secondary | ICD-10-CM | POA: Diagnosis not present

## 2023-03-16 DIAGNOSIS — Z419 Encounter for procedure for purposes other than remedying health state, unspecified: Secondary | ICD-10-CM | POA: Diagnosis not present

## 2023-03-23 DIAGNOSIS — G4733 Obstructive sleep apnea (adult) (pediatric): Secondary | ICD-10-CM | POA: Diagnosis not present

## 2023-03-23 DIAGNOSIS — Z6841 Body Mass Index (BMI) 40.0 and over, adult: Secondary | ICD-10-CM | POA: Diagnosis not present

## 2023-03-23 DIAGNOSIS — I129 Hypertensive chronic kidney disease with stage 1 through stage 4 chronic kidney disease, or unspecified chronic kidney disease: Secondary | ICD-10-CM | POA: Diagnosis not present

## 2023-03-23 DIAGNOSIS — E119 Type 2 diabetes mellitus without complications: Secondary | ICD-10-CM | POA: Diagnosis not present

## 2023-03-23 DIAGNOSIS — E782 Mixed hyperlipidemia: Secondary | ICD-10-CM | POA: Diagnosis not present

## 2023-03-23 DIAGNOSIS — Z125 Encounter for screening for malignant neoplasm of prostate: Secondary | ICD-10-CM | POA: Diagnosis not present

## 2023-03-23 DIAGNOSIS — Z0000 Encounter for general adult medical examination without abnormal findings: Secondary | ICD-10-CM | POA: Diagnosis not present

## 2023-04-16 DIAGNOSIS — Z419 Encounter for procedure for purposes other than remedying health state, unspecified: Secondary | ICD-10-CM | POA: Diagnosis not present

## 2023-04-22 DIAGNOSIS — G309 Alzheimer's disease, unspecified: Secondary | ICD-10-CM | POA: Diagnosis not present

## 2023-04-22 DIAGNOSIS — F0280 Dementia in other diseases classified elsewhere without behavioral disturbance: Secondary | ICD-10-CM | POA: Diagnosis not present

## 2023-04-22 DIAGNOSIS — F0150 Vascular dementia without behavioral disturbance: Secondary | ICD-10-CM | POA: Diagnosis not present

## 2023-05-16 DIAGNOSIS — Z419 Encounter for procedure for purposes other than remedying health state, unspecified: Secondary | ICD-10-CM | POA: Diagnosis not present
